# Patient Record
Sex: Female | Born: 1985 | Race: Asian | Hispanic: No | Marital: Single | Smoking: Never smoker
Health system: Southern US, Community
[De-identification: ages and names within clinical notes are randomized; demographics above are authoritative.]

---

## 2008-01-18 ENCOUNTER — Ambulatory Visit (HOSPITAL_COMMUNITY): Admission: RE | Admit: 2008-01-18 | Discharge: 2008-01-18 | Payer: Self-pay | Admitting: Family Medicine

## 2008-02-01 ENCOUNTER — Ambulatory Visit (HOSPITAL_COMMUNITY): Admission: RE | Admit: 2008-02-01 | Discharge: 2008-02-01 | Payer: Self-pay | Admitting: Family Medicine

## 2008-02-17 ENCOUNTER — Encounter: Payer: Self-pay | Admitting: Family Medicine

## 2008-02-17 ENCOUNTER — Ambulatory Visit: Payer: Self-pay | Admitting: Obstetrics & Gynecology

## 2008-02-17 LAB — CONVERTED CEMR LAB
BUN: 7 mg/dL (ref 6–23)
CO2: 23 meq/L (ref 19–32)
Creatinine 24 HR UR: 1455 mg/24hr (ref 700–1800)
Creatinine, Ser: 0.46 mg/dL (ref 0.40–1.20)
Creatinine, Urine: 85.6 mg/dL
Glucose, Bld: 122 mg/dL — ABNORMAL HIGH (ref 70–99)
TSH: 1.557 microintl units/mL (ref 0.350–4.50)
Total Bilirubin: 0.2 mg/dL — ABNORMAL LOW (ref 0.3–1.2)
Uric Acid, Serum: 3.8 mg/dL (ref 2.4–7.0)

## 2008-03-02 ENCOUNTER — Ambulatory Visit: Payer: Self-pay | Admitting: Obstetrics & Gynecology

## 2008-03-16 ENCOUNTER — Encounter: Payer: Self-pay | Admitting: Obstetrics and Gynecology

## 2008-03-16 ENCOUNTER — Ambulatory Visit (HOSPITAL_COMMUNITY): Admission: RE | Admit: 2008-03-16 | Discharge: 2008-03-16 | Payer: Self-pay | Admitting: Obstetrics & Gynecology

## 2008-03-16 ENCOUNTER — Ambulatory Visit: Payer: Self-pay | Admitting: Family Medicine

## 2008-04-06 ENCOUNTER — Ambulatory Visit: Payer: Self-pay | Admitting: Family Medicine

## 2008-04-06 ENCOUNTER — Encounter: Payer: Self-pay | Admitting: Obstetrics and Gynecology

## 2008-04-06 LAB — CONVERTED CEMR LAB
MCHC: 32 g/dL (ref 30.0–36.0)
MCV: 88.5 fL (ref 78.0–100.0)
Platelets: 249 10*3/uL (ref 150–400)

## 2008-04-07 ENCOUNTER — Ambulatory Visit (HOSPITAL_COMMUNITY): Admission: RE | Admit: 2008-04-07 | Discharge: 2008-04-07 | Payer: Self-pay | Admitting: Family Medicine

## 2008-04-20 ENCOUNTER — Ambulatory Visit: Payer: Self-pay | Admitting: Obstetrics & Gynecology

## 2008-04-20 ENCOUNTER — Encounter: Payer: Self-pay | Admitting: Obstetrics and Gynecology

## 2008-05-04 ENCOUNTER — Ambulatory Visit: Payer: Self-pay | Admitting: Family Medicine

## 2008-05-08 ENCOUNTER — Ambulatory Visit: Payer: Self-pay | Admitting: Obstetrics & Gynecology

## 2008-05-15 ENCOUNTER — Ambulatory Visit: Payer: Self-pay | Admitting: Obstetrics & Gynecology

## 2008-05-18 ENCOUNTER — Ambulatory Visit: Payer: Self-pay | Admitting: Obstetrics & Gynecology

## 2008-05-18 ENCOUNTER — Ambulatory Visit (HOSPITAL_COMMUNITY): Admission: RE | Admit: 2008-05-18 | Discharge: 2008-05-18 | Payer: Self-pay | Admitting: Family Medicine

## 2008-05-22 ENCOUNTER — Ambulatory Visit: Payer: Self-pay | Admitting: Obstetrics & Gynecology

## 2008-05-25 ENCOUNTER — Ambulatory Visit: Payer: Self-pay | Admitting: Family Medicine

## 2008-05-29 ENCOUNTER — Ambulatory Visit: Payer: Self-pay | Admitting: Obstetrics & Gynecology

## 2008-06-01 ENCOUNTER — Encounter: Payer: Self-pay | Admitting: Obstetrics and Gynecology

## 2008-06-01 ENCOUNTER — Ambulatory Visit: Payer: Self-pay | Admitting: Obstetrics & Gynecology

## 2008-06-02 ENCOUNTER — Encounter: Payer: Self-pay | Admitting: Obstetrics & Gynecology

## 2008-06-05 ENCOUNTER — Ambulatory Visit: Payer: Self-pay | Admitting: Obstetrics & Gynecology

## 2008-06-08 ENCOUNTER — Encounter: Payer: Self-pay | Admitting: Obstetrics and Gynecology

## 2008-06-08 ENCOUNTER — Ambulatory Visit: Payer: Self-pay | Admitting: Family Medicine

## 2008-06-12 ENCOUNTER — Ambulatory Visit: Payer: Self-pay | Admitting: Family Medicine

## 2008-06-15 ENCOUNTER — Ambulatory Visit: Payer: Self-pay | Admitting: Family Medicine

## 2008-06-19 ENCOUNTER — Ambulatory Visit: Payer: Self-pay | Admitting: Family Medicine

## 2008-06-22 ENCOUNTER — Ambulatory Visit: Payer: Self-pay | Admitting: Obstetrics & Gynecology

## 2008-06-26 ENCOUNTER — Ambulatory Visit: Payer: Self-pay | Admitting: Obstetrics & Gynecology

## 2008-06-28 ENCOUNTER — Ambulatory Visit: Payer: Self-pay | Admitting: Advanced Practice Midwife

## 2008-06-28 ENCOUNTER — Inpatient Hospital Stay (HOSPITAL_COMMUNITY): Admission: AD | Admit: 2008-06-28 | Discharge: 2008-07-01 | Payer: Self-pay | Admitting: Family Medicine

## 2009-08-18 ENCOUNTER — Inpatient Hospital Stay (HOSPITAL_COMMUNITY): Admission: AD | Admit: 2009-08-18 | Discharge: 2009-08-18 | Payer: Self-pay | Admitting: Obstetrics and Gynecology

## 2009-08-19 ENCOUNTER — Inpatient Hospital Stay (HOSPITAL_COMMUNITY): Admission: AD | Admit: 2009-08-19 | Discharge: 2009-08-20 | Payer: Self-pay | Admitting: Obstetrics and Gynecology

## 2010-03-29 ENCOUNTER — Inpatient Hospital Stay (HOSPITAL_COMMUNITY)
Admission: AD | Admit: 2010-03-29 | Discharge: 2010-03-29 | Payer: Self-pay | Source: Home / Self Care | Admitting: Obstetrics & Gynecology

## 2010-04-20 ENCOUNTER — Inpatient Hospital Stay (HOSPITAL_COMMUNITY)
Admission: AD | Admit: 2010-04-20 | Discharge: 2010-04-21 | Payer: Self-pay | Source: Home / Self Care | Attending: Obstetrics and Gynecology | Admitting: Obstetrics and Gynecology

## 2010-07-16 LAB — URINE MICROSCOPIC-ADD ON

## 2010-07-16 LAB — WET PREP, GENITAL
Clue Cells Wet Prep HPF POC: NONE SEEN
Yeast Wet Prep HPF POC: NONE SEEN

## 2010-07-16 LAB — CBC
HCT: 35.5 % — ABNORMAL LOW (ref 36.0–46.0)
Hemoglobin: 11.9 g/dL — ABNORMAL LOW (ref 12.0–15.0)
MCV: 87 fL (ref 78.0–100.0)
RDW: 14.3 % (ref 11.5–15.5)
WBC: 9 10*3/uL (ref 4.0–10.5)

## 2010-07-16 LAB — URINALYSIS, ROUTINE W REFLEX MICROSCOPIC
Ketones, ur: NEGATIVE mg/dL
Protein, ur: NEGATIVE mg/dL
Urobilinogen, UA: 0.2 mg/dL (ref 0.0–1.0)

## 2010-07-16 LAB — GC/CHLAMYDIA PROBE AMP, GENITAL: GC Probe Amp, Genital: NEGATIVE

## 2010-07-23 LAB — DIFFERENTIAL
Basophils Absolute: 0 10*3/uL (ref 0.0–0.1)
Basophils Relative: 0 % (ref 0–1)
Eosinophils Absolute: 0.1 10*3/uL (ref 0.0–0.7)
Eosinophils Relative: 1 % (ref 0–5)
Monocytes Absolute: 1 10*3/uL (ref 0.1–1.0)
Monocytes Relative: 7 % (ref 3–12)
Neutro Abs: 9 10*3/uL — ABNORMAL HIGH (ref 1.7–7.7)

## 2010-07-23 LAB — CBC
HCT: 39.2 % (ref 36.0–46.0)
Hemoglobin: 12.7 g/dL (ref 12.0–15.0)
MCHC: 32.3 g/dL (ref 30.0–36.0)
MCHC: 33.2 g/dL (ref 30.0–36.0)
MCV: 88 fL (ref 78.0–100.0)
MCV: 88 fL (ref 78.0–100.0)
RBC: 4.45 MIL/uL (ref 3.87–5.11)
RDW: 15.4 % (ref 11.5–15.5)
RDW: 15.5 % (ref 11.5–15.5)

## 2010-08-19 LAB — POCT URINALYSIS DIP (DEVICE)
Bilirubin Urine: NEGATIVE
Bilirubin Urine: NEGATIVE
Glucose, UA: NEGATIVE mg/dL
Glucose, UA: NEGATIVE mg/dL
Ketones, ur: NEGATIVE mg/dL
Ketones, ur: NEGATIVE mg/dL
Nitrite: NEGATIVE
Nitrite: NEGATIVE
Protein, ur: 30 mg/dL — AB
Protein, ur: 30 mg/dL — AB
Specific Gravity, Urine: 1.015 (ref 1.005–1.030)
Urobilinogen, UA: 0.2 mg/dL (ref 0.0–1.0)
Urobilinogen, UA: 0.2 mg/dL (ref 0.0–1.0)
pH: 7 (ref 5.0–8.0)

## 2010-08-20 LAB — POCT URINALYSIS DIP (DEVICE)
Bilirubin Urine: NEGATIVE
Bilirubin Urine: NEGATIVE
Bilirubin Urine: NEGATIVE
Glucose, UA: NEGATIVE mg/dL
Glucose, UA: NEGATIVE mg/dL
Glucose, UA: NEGATIVE mg/dL
Hgb urine dipstick: NEGATIVE
Ketones, ur: NEGATIVE mg/dL
Ketones, ur: NEGATIVE mg/dL
Nitrite: NEGATIVE
Nitrite: NEGATIVE
Specific Gravity, Urine: 1.02 (ref 1.005–1.030)
Urobilinogen, UA: 0.2 mg/dL (ref 0.0–1.0)

## 2010-08-20 LAB — COMPREHENSIVE METABOLIC PANEL
ALT: 16 U/L (ref 0–35)
AST: 23 U/L (ref 0–37)
Albumin: 2.8 g/dL — ABNORMAL LOW (ref 3.5–5.2)
Alkaline Phosphatase: 192 U/L — ABNORMAL HIGH (ref 39–117)
BUN: 5 mg/dL — ABNORMAL LOW (ref 6–23)
CO2: 25 mEq/L (ref 19–32)
Calcium: 9.3 mg/dL (ref 8.4–10.5)
Chloride: 102 mEq/L (ref 96–112)
Creatinine, Ser: 0.48 mg/dL (ref 0.4–1.2)
GFR calc Af Amer: >60 mL/min (ref >60)
GFR calc non Af Amer: >60 mL/min (ref >60)
Glucose, Bld: 94 mg/dL (ref 70–99)
Potassium: 3.8 mEq/L (ref 3.5–5.1)
Sodium: 135 mEq/L (ref 135–145)
Total Bilirubin: 0.5 mg/dL (ref 0.3–1.2)
Total Protein: 7.3 g/dL (ref 6.0–8.3)

## 2010-08-20 LAB — CBC
HCT: 41.6 % (ref 36.0–46.0)
Hemoglobin: 13.6 g/dL (ref 12.0–15.0)
MCHC: 32.6 g/dL (ref 30.0–36.0)
MCV: 88.2 fL (ref 78.0–100.0)
Platelets: 234 10*3/uL (ref 150–400)
RBC: 4.72 MIL/uL (ref 3.87–5.11)
RDW: 15.2 % (ref 11.5–15.5)
WBC: 15.9 10*3/uL — ABNORMAL HIGH (ref 4.0–10.5)

## 2010-08-20 LAB — LACTATE DEHYDROGENASE: LDH: 118 U/L (ref 94–250)

## 2010-08-20 LAB — URIC ACID: Uric Acid, Serum: 5.1 mg/dL (ref 2.4–7.0)

## 2010-10-13 ENCOUNTER — Inpatient Hospital Stay (HOSPITAL_COMMUNITY)
Admission: AD | Admit: 2010-10-13 | Discharge: 2010-10-14 | DRG: 775 | Disposition: A | Discharge status: Home / Self Care | Payer: Medicaid Other | Source: Ambulatory Visit | Attending: Obstetrics and Gynecology | Admitting: Obstetrics and Gynecology

## 2010-10-13 LAB — CBC
Hemoglobin: 12.4 g/dL (ref 12.0–15.0)
MCH: 26.1 pg (ref 26.0–34.0)
Platelets: 254 10*3/uL (ref 150–400)
RBC: 4.76 MIL/uL (ref 3.87–5.11)
WBC: 14.8 10*3/uL — ABNORMAL HIGH (ref 4.0–10.5)

## 2010-10-13 LAB — ABO/RH: ABO/RH(D): O POS

## 2010-10-13 LAB — RPR: RPR Ser Ql: NONREACTIVE

## 2010-10-14 LAB — CBC
HCT: 30.7 % — ABNORMAL LOW (ref 36.0–46.0)
MCHC: 31.9 g/dL (ref 30.0–36.0)
MCV: 80.4 fL (ref 78.0–100.0)
RDW: 16.7 % — ABNORMAL HIGH (ref 11.5–15.5)

## 2011-02-04 LAB — POCT URINALYSIS DIP (DEVICE)
Bilirubin Urine: NEGATIVE
Hgb urine dipstick: NEGATIVE
Hgb urine dipstick: NEGATIVE
Nitrite: NEGATIVE
Nitrite: NEGATIVE
Nitrite: NEGATIVE
Urobilinogen, UA: 0.2
Urobilinogen, UA: 0.2
Urobilinogen, UA: 0.2
pH: 6.5
pH: 7
pH: 7

## 2011-02-07 LAB — POCT URINALYSIS DIP (DEVICE)
Glucose, UA: NEGATIVE mg/dL
Hgb urine dipstick: NEGATIVE
Hgb urine dipstick: NEGATIVE
Ketones, ur: NEGATIVE mg/dL
Nitrite: NEGATIVE
Protein, ur: 100 mg/dL — AB
Protein, ur: 30 mg/dL — AB
Protein, ur: NEGATIVE mg/dL
Specific Gravity, Urine: 1.02 (ref 1.005–1.030)
Urobilinogen, UA: 0.2 mg/dL (ref 0.0–1.0)
Urobilinogen, UA: 0.2 mg/dL (ref 0.0–1.0)
pH: 6.5 (ref 5.0–8.0)
pH: 7 (ref 5.0–8.0)
pH: 7 (ref 5.0–8.0)

## 2011-05-04 IMAGING — US US OB COMP LESS 14 WK
1 series · 13 of 28 positions shown · non-contrast
Comparison: Prior ultrasound of pregnancy performed 05/18/2008

CLINICAL DATA: Vaginal bleeding.

OBSTETRIC <14 WK ULTRASOUND
TECHNIQUE: Transabdominal ultrasound was performed for evaluation
of the gestation as well as the maternal uterus and adnexal
regions.

[Series 1: us ob comp less 14 wks · 0.19mm/px · 13 of 44 slices shown]
[im 2/44]
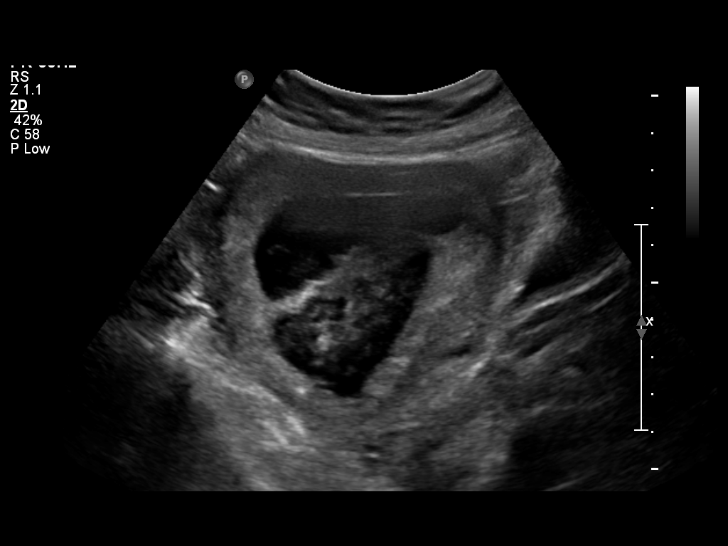
[im 5/44]
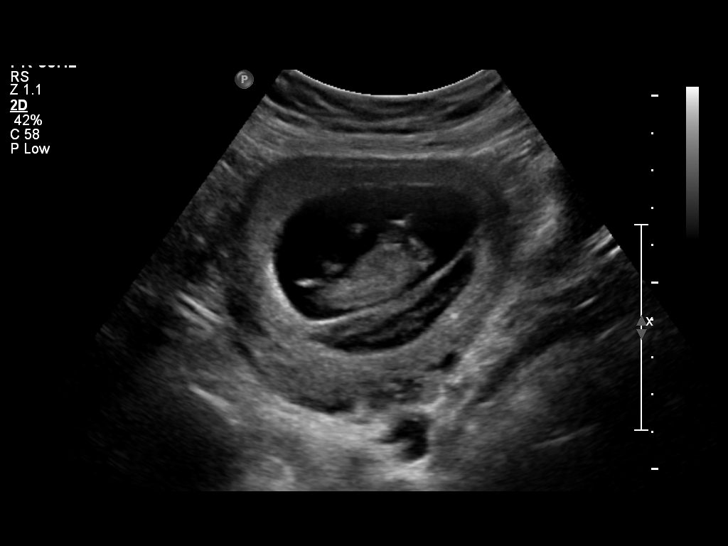
[im 8/44]
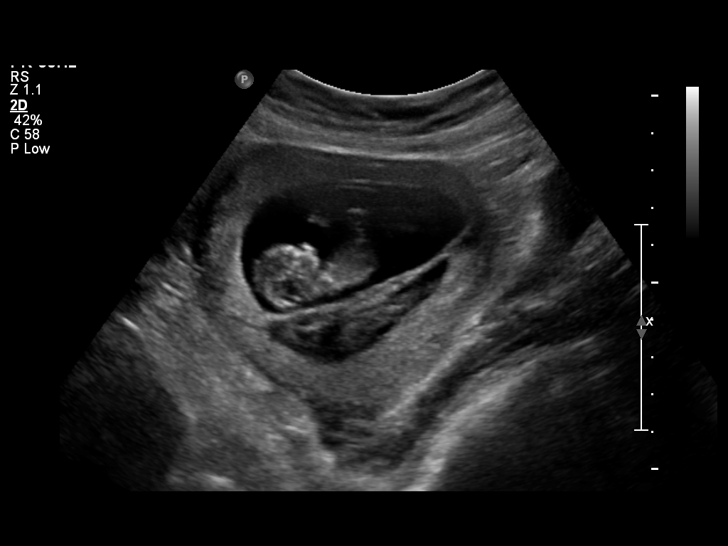
[im 12/44]
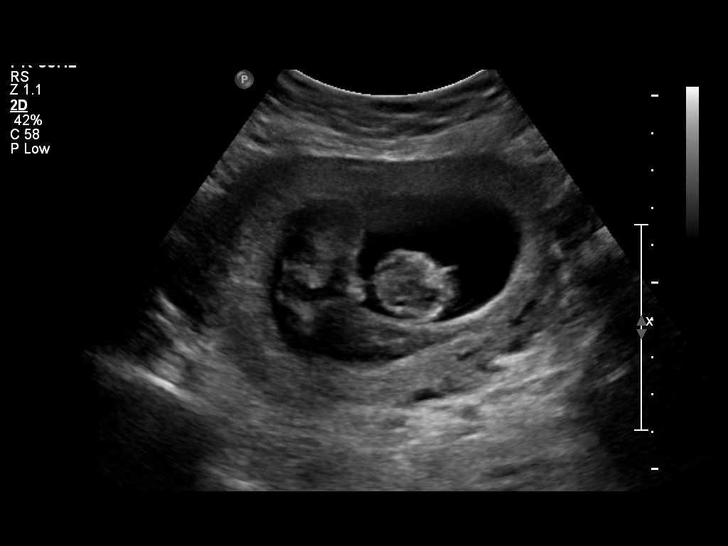
[im 15/44]
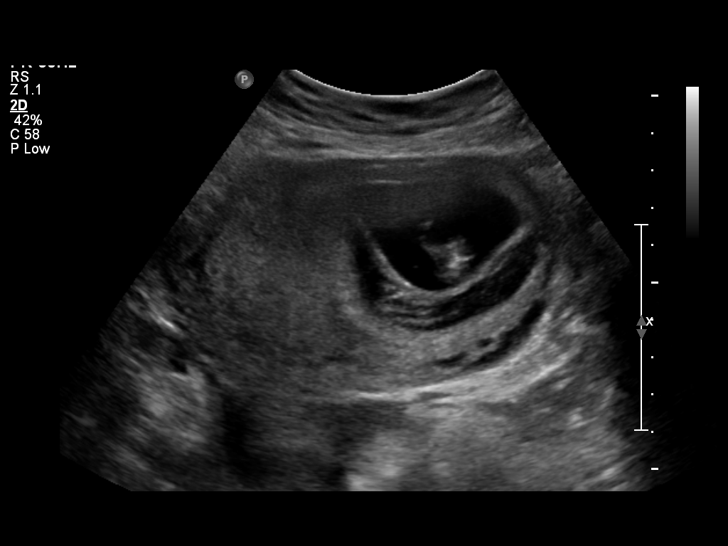
[im 18/44]
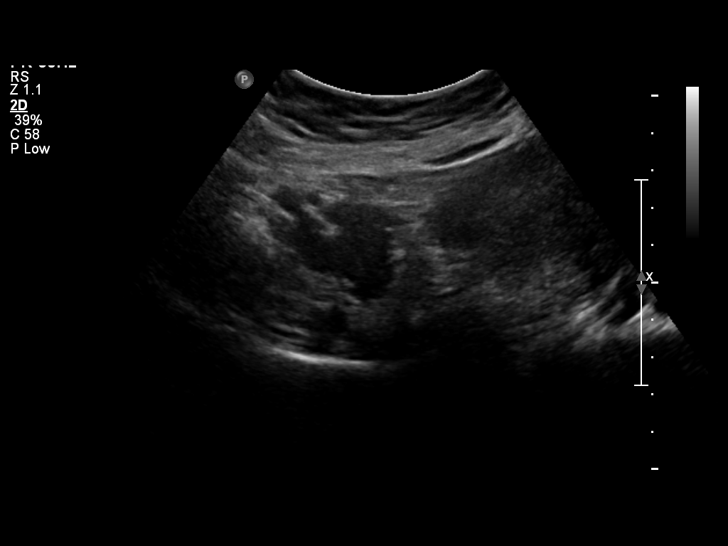
[im 23/44]
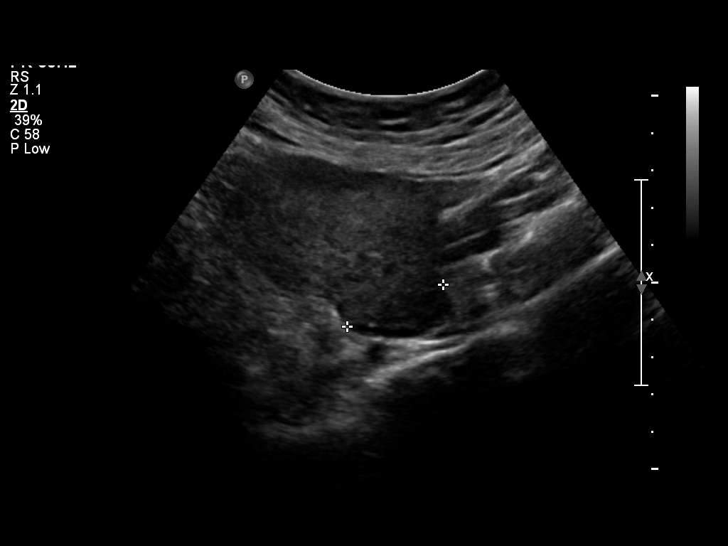
[im 26/44]
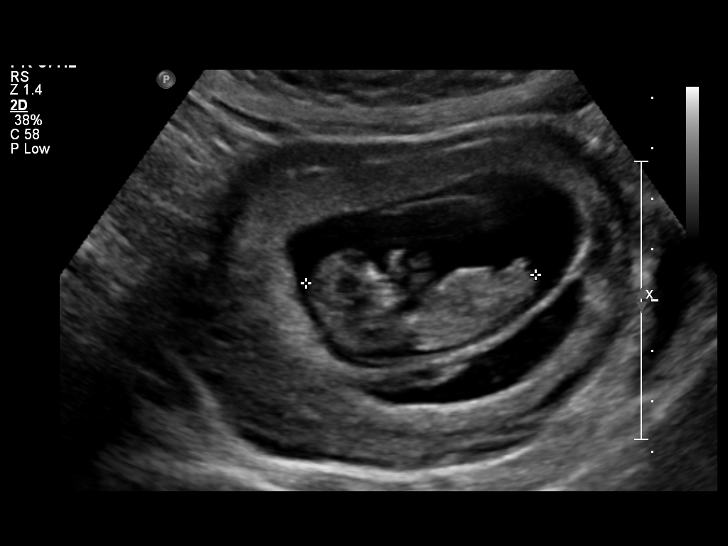
[im 29/44]
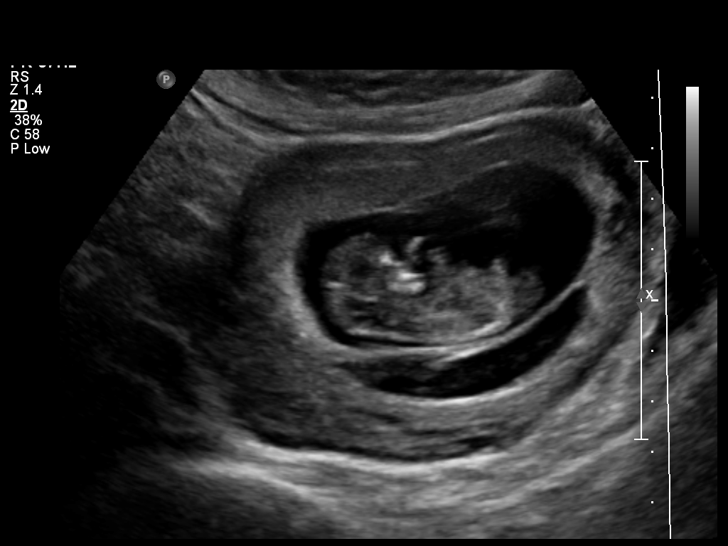
[im 32/44]
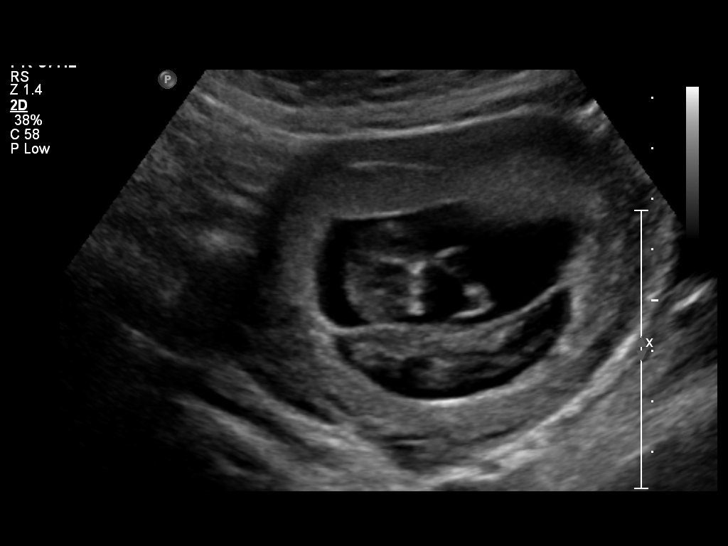
[im 36/44]
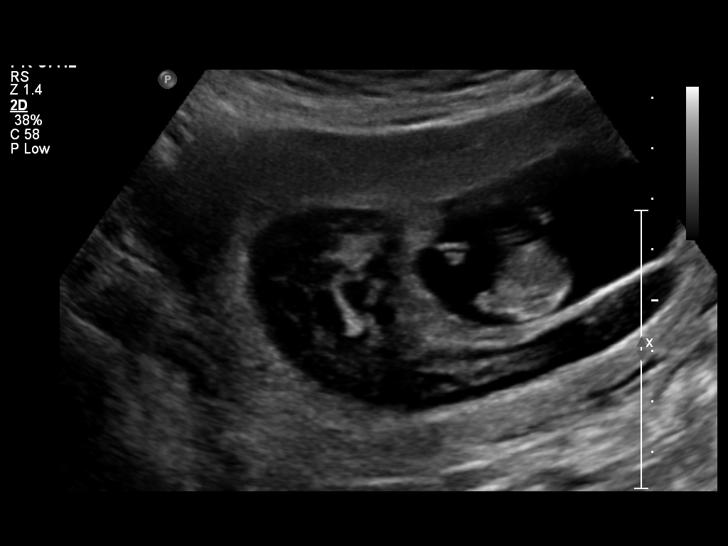
[im 39/44]
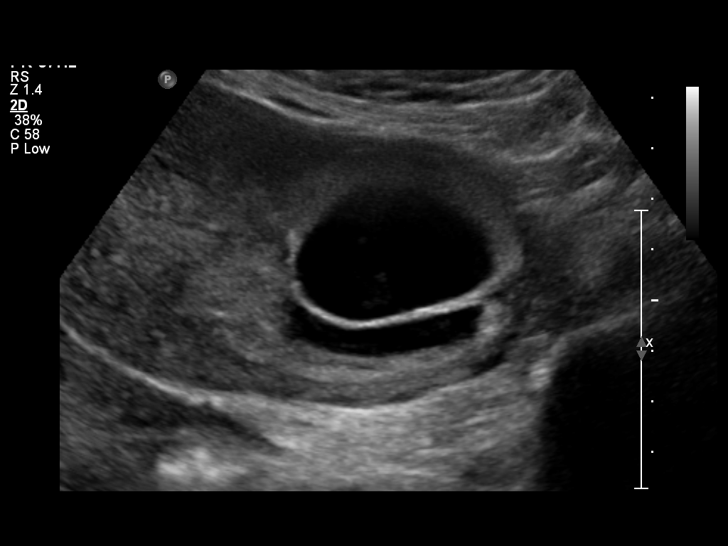
[im 42/44]
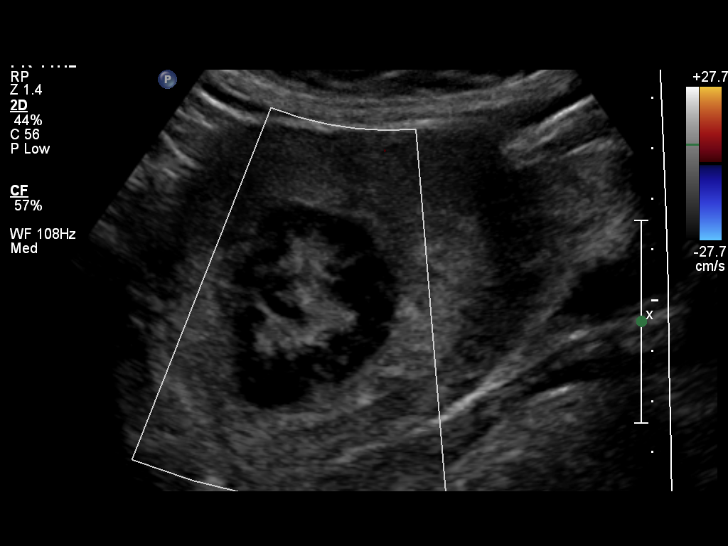

[13 of 28 positions shown; findings below may reference images not displayed]

Intrauterine gestational sac: Single; visualized/normal in shape.
Yolk sac: Yes
Embryo: Yes
Cardiac Activity: Yes
Heart Rate: 169 bpm

CRL:  4.6 cm  11w  3d         US EDC: 10/15/2010

Maternal uterus/Adnexae:
A large amount of subchorionic hemorrhage is noted.  The uterus is
otherwise unremarkable in appearance.

The ovaries are within normal limits.  The right ovary measures
x 1.9 x 1.9 cm, while the left ovary measures 3.0 x 1.5 x 2.8 cm.
No suspicious adnexal masses are seen.  There is no definite
evidence for ovarian torsion.

No free fluid is seen in the pelvic cul-de-sac.
IMPRESSION: 1.  Single live intrauterine pregnancy, with a crown-rump length of
4.6 cm, corresponding to a gestational age of 11 weeks 3 days.
This matches the patient's gestational age of 12 weeks 0 days by
LMP, and reflects an estimated date of delivery October 11, 2010.
2.  Large amount of subchorionic hemorrhage noted.

## 2012-09-19 ENCOUNTER — Inpatient Hospital Stay (HOSPITAL_COMMUNITY)
Admission: AD | Admit: 2012-09-19 | Discharge: 2012-09-19 | Disposition: A | Discharge status: Home / Self Care | Payer: Self-pay | Source: Ambulatory Visit | Attending: Obstetrics & Gynecology | Admitting: Obstetrics & Gynecology

## 2012-09-19 ENCOUNTER — Inpatient Hospital Stay (HOSPITAL_COMMUNITY): Payer: Self-pay

## 2012-09-19 ENCOUNTER — Encounter (HOSPITAL_COMMUNITY): Payer: Self-pay | Admitting: *Deleted

## 2012-09-19 DIAGNOSIS — O039 Complete or unspecified spontaneous abortion without complication: Secondary | ICD-10-CM | POA: Insufficient documentation

## 2012-09-19 LAB — URINE MICROSCOPIC-ADD ON

## 2012-09-19 LAB — CBC WITH DIFFERENTIAL/PLATELET
Basophils Absolute: 0 10*3/uL (ref 0.0–0.1)
Basophils Relative: 0 % (ref 0–1)
Eosinophils Relative: 2 % (ref 0–5)
HCT: 39.4 % (ref 36.0–46.0)
Hemoglobin: 12.9 g/dL (ref 12.0–15.0)
MCHC: 32.7 g/dL (ref 30.0–36.0)
MCV: 87.6 fL (ref 78.0–100.0)
Monocytes Absolute: 1.2 10*3/uL — ABNORMAL HIGH (ref 0.1–1.0)
Monocytes Relative: 9 % (ref 3–12)
RDW: 13.2 % (ref 11.5–15.5)

## 2012-09-19 LAB — URINALYSIS, ROUTINE W REFLEX MICROSCOPIC
Bilirubin Urine: NEGATIVE
Ketones, ur: NEGATIVE mg/dL
Protein, ur: NEGATIVE mg/dL
Urobilinogen, UA: 0.2 mg/dL (ref 0.0–1.0)

## 2012-09-19 LAB — WET PREP, GENITAL
Trich, Wet Prep: NONE SEEN
Yeast Wet Prep HPF POC: NONE SEEN

## 2012-09-19 MED ORDER — CEPHALEXIN 500 MG PO CAPS: 500.0000 mg | ORAL_CAPSULE | Freq: Four times a day (QID) | ORAL | Status: AC

## 2012-09-19 MED ORDER — IBUPROFEN 600 MG PO TABS: 600.0000 mg | ORAL_TABLET | Freq: Four times a day (QID) | ORAL | Status: AC | PRN

## 2012-09-19 MED ORDER — OXYCODONE-ACETAMINOPHEN 5-325 MG PO TABS: 2.0000 | ORAL_TABLET | ORAL | Status: AC | PRN

## 2012-09-19 MED ORDER — PROMETHAZINE HCL 25 MG PO TABS: 25.0000 mg | ORAL_TABLET | Freq: Four times a day (QID) | ORAL | Status: AC | PRN

## 2012-09-19 NOTE — MAU Note (Signed)
Pt reports she has been spotting since Thursday, this am had cramping. Has had cramping off/on all day.positive home preg test one month ago.

## 2012-09-19 NOTE — MAU Provider Note (Signed)
History     CSN: [id number]  Arrival date & time 09/19/12  1927   None     Chief Complaint  Patient presents with  . Possible Pregnancy  . Dysmenorrhea    (Consider location/radiation/quality/duration/timing/severity/associated sxs/prior treatment) HPI Edward Delamar is a 27 y.o. [id number] at 13 3/7 wks by unsure LMP 06/19/12. She started pink spotting with wiping 5/15. It ash occurred off/on since then. She had low cramping this am, none since. Her discharge is mucoid, pink, no odor or itching. No UTI S&S or GI changes. Hasn't felt pregnant for a few weeks.   History reviewed. No pertinent past medical history.  History reviewed. No pertinent past surgical history.  History reviewed. No pertinent family history.  History  Substance Use Topics  . Smoking status: Never Smoker   . Smokeless tobacco: Never Used  . Alcohol Use: No    OB History   Grav Para Term Preterm Abortions TAB SAB Ect Mult Living   4 3 3       3       Review of Systems  Constitutional: Negative for fever and chills.  Gastrointestinal: Negative for nausea, vomiting, diarrhea and constipation.  Genitourinary: Positive for vaginal bleeding, vaginal discharge and pelvic pain. Negative for dysuria, urgency and frequency.    Allergies  Review of patient's allergies indicates no known allergies.  Home Medications   Current Outpatient Rx  Name  Route  Sig  Dispense  Refill  . acetaminophen (TYLENOL) 325 MG tablet   Oral   Take 650 mg by mouth every 6 (six) hours as needed for pain.         . cephALEXin (KEFLEX) 500 MG capsule   Oral   Take 1 capsule (500 mg total) by mouth 4 (four) times daily.   20 capsule   0   . ibuprofen (ADVIL,MOTRIN) 600 MG tablet   Oral   Take 1 tablet (600 mg total) by mouth every 6 (six) hours as needed for pain.   30 tablet   0   . oxyCODONE-acetaminophen (PERCOCET/ROXICET) 5-325 MG per tablet   Oral   Take 2 tablets by mouth every 4 (four) hours as needed for pain.  15 tablet   0   . promethazine (PHENERGAN) 25 MG tablet   Oral   Take 1 tablet (25 mg total) by mouth every 6 (six) hours as needed for nausea.   30 tablet   0     BP 121/75  Pulse 64  Temp(Src) 98.2 F (36.8 C) (Oral)  Resp 18  Ht 5\' 1"  (1.549 m)  Wt 176 lb (79.833 kg)  BMI 33.27 kg/m2  SpO2 100%  LMP 06/19/2012  Physical Exam  Constitutional: She is oriented to person, place, and time. She appears well-developed and well-nourished.  Abdominal: Soft. There is no tenderness.  Genitourinary:  Pelvic exam: Vulva- nl anatomy, skin intact Vagina- small amt mucoid bloody discharge Cx- parous Uterus- 6-8 wk size, non tender Adn- non tender, no masses palp  Musculoskeletal: Normal range of motion.  Neurological: She is alert and oriented to person, place, and time.  Skin: Skin is warm and dry.  Psychiatric: She has a normal mood and affect. Her behavior is normal.    ED Course  Procedures (including critical care time) 2055 awaiting results. Care to Joseph Berkshire, PA Labs Reviewed  WET PREP, GENITAL - Abnormal; Notable for the following:    Clue Cells Wet Prep HPF POC FEW (*)    WBC, Wet Prep  HPF POC MODERATE (*)    All other components within normal limits  URINALYSIS, ROUTINE W REFLEX MICROSCOPIC - Abnormal; Notable for the following:    Hgb urine dipstick LARGE (*)    Nitrite POSITIVE (*)    Leukocytes, UA TRACE (*)    All other components within normal limits  CBC WITH DIFFERENTIAL - Abnormal; Notable for the following:    WBC 12.4 (*)    Lymphs Abs 4.3 (*)    Monocytes Absolute 1.2 (*)    All other components within normal limits  HCG, QUANTITATIVE, PREGNANCY - Abnormal; Notable for the following:    hCG, Beta Chain, Quant, S 1594 (*)    All other components within normal limits  URINE MICROSCOPIC-ADD ON - Abnormal; Notable for the following:    Squamous Epithelial / LPF FEW (*)    Bacteria, UA FEW (*)    Crystals CA OXALATE CRYSTALS (*)    All other  components within normal limits  POCT PREGNANCY, URINE - Abnormal; Notable for the following:    Preg Test, Ur POSITIVE (*)    All other components within normal limits  URINE CULTURE  GC/CHLAMYDIA PROBE AMP   US Ob Comp Less 14 Wks  09/19/2012   OBSTETRICAL ULTRASOUND: This exam was performed within a [facility]. The OB US report was generated in the [facility] system, and faxed to the ordering physician.   This report is also available in [employer] and in the [employer]. See [facility] Obstetric US report.    1. SAB (spontaneous abortion)       MDM   2100 - Care assumed from Eveline Keto, NP Patient waiting for [place].   MDM Discussed [place] results with Dr. Despina Hidden. Ok to offer cytotec. Discussed options with patient of expectant management vs cytotec for SAB. Patient has chosen [facility]. Appropriate pain medications will be dispensed. Bleeding precautions discussed.   A: SAB UTI  P: Discharge home Rx for percocet given. Rx for Phenergan, Ibuprofen and Keflex sent to patient's pharmacy Bleeding precautions discussed Patient will follow-up in [facility] clinic in ~ 2 weeks. Referral sent to clinic, they will contact patient with an appointment Patient may return to MAU as needed or if her condition were to change or worsen  Freddi Starr, PA-C 09/20/2012 12:36 AM

## 2012-09-20 LAB — GC/CHLAMYDIA PROBE AMP
CT Probe RNA: NEGATIVE
GC Probe RNA: NEGATIVE

## 2012-09-21 LAB — URINE CULTURE: Colony Count: 15000

## 2012-09-22 ENCOUNTER — Encounter (HOSPITAL_COMMUNITY): Payer: Self-pay | Admitting: *Deleted

## 2012-09-22 ENCOUNTER — Inpatient Hospital Stay (HOSPITAL_COMMUNITY)
Admission: AD | Admit: 2012-09-22 | Discharge: 2012-09-22 | Disposition: A | Discharge status: Home / Self Care | Payer: Self-pay | Source: Ambulatory Visit | Attending: Obstetrics & Gynecology | Admitting: Obstetrics & Gynecology

## 2012-09-22 DIAGNOSIS — O039 Complete or unspecified spontaneous abortion without complication: Secondary | ICD-10-CM | POA: Insufficient documentation

## 2012-09-22 NOTE — MAU Note (Signed)
Pt was seen on Sunday and was told she was having a miscarriage.  Monday she started to have cramping and passed two large clots.  Today the cramping came like contractions pains and passed more in toilet in MAU.

## 2012-09-22 NOTE — MAU Provider Note (Signed)
History     CSN: [id number]  Arrival date and time: 09/22/12 1622   None     Chief Complaint  Patient presents with  . Threatened Miscarriage  . Vaginal Bleeding   HPI 27 y.o. [id number] here c/o ongoing vaginal bleeding and cramping. States she had some strong cramping on Monday and passed some clots. Had strong cramping again today and passed a larger clot or tissue after arrival to MAU. Pain has since resolved. Seen in MAU on 5/18 and diagnosed with failed IUP, offered cytotec vs. Expectant mgmt, chose expectant mgmt. Given rx for percocet and motrin - states she has not been able to take the percocet because she is caring for her children, but motrin and tylenol aren't   History reviewed. No pertinent past medical history.  History reviewed. No pertinent past surgical history.  History reviewed. No pertinent family history.  History  Substance Use Topics  . Smoking status: Never Smoker   . Smokeless tobacco: Never Used  . Alcohol Use: No    Allergies: No Known Allergies  Prescriptions prior to admission  Medication Sig Dispense Refill  . acetaminophen (TYLENOL) 325 MG tablet Take 650 mg by mouth every 6 (six) hours as needed for pain.      . cephALEXin (KEFLEX) 500 MG capsule Take 1 capsule (500 mg total) by mouth 4 (four) times daily.  20 capsule  0  . ibuprofen (ADVIL,MOTRIN) 600 MG tablet Take 1 tablet (600 mg total) by mouth every 6 (six) hours as needed for pain.  30 tablet  0  . oxyCODONE-acetaminophen (PERCOCET/ROXICET) 5-325 MG per tablet Take 2 tablets by mouth every 4 (four) hours as needed for pain.  15 tablet  0  . promethazine (PHENERGAN) 25 MG tablet Take 1 tablet (25 mg total) by mouth every 6 (six) hours as needed for nausea.  30 tablet  0    Review of Systems  Constitutional: Negative.   Respiratory: Negative.   Cardiovascular: Negative.   Gastrointestinal: Positive for abdominal pain.  Genitourinary:       + bleeding    Physical Exam   Blood  pressure 111/85, pulse 77, temperature 98.5 F (36.9 C), temperature source Oral, resp. rate 18, height 5\' 1"  (1.549 m), weight 176 lb (79.833 kg), last menstrual period 06/19/2012, SpO2 100.00%.  Physical Exam  Nursing note and vitals reviewed. Constitutional: She is oriented to person, place, and time. She appears well-developed and well-nourished. No distress.  Cardiovascular: Normal rate.   Respiratory: Effort normal.  Musculoskeletal: Normal range of motion.  Neurological: She is alert and oriented to person, place, and time.  Skin: Skin is warm and dry.  Psychiatric: She has a normal mood and affect.    MAU Course  Procedures    Assessment and Plan   1. Abortion, spontaneous   Rev'd precautions and normal course of SAB, pt reassured. F/U in clinic as scheduled or sooner PRN.     Medication List    TAKE these medications       acetaminophen 325 MG tablet  Commonly known as:  TYLENOL  Take 650 mg by mouth every 6 (six) hours as needed for pain.     cephALEXin 500 MG capsule  Commonly known as:  KEFLEX  Take 1 capsule (500 mg total) by mouth 4 (four) times daily.     ibuprofen 600 MG tablet  Commonly known as:  ADVIL,MOTRIN  Take 1 tablet (600 mg total) by mouth every 6 (six) hours as needed for  pain.     oxyCODONE-acetaminophen 5-325 MG per tablet  Commonly known as:  PERCOCET/ROXICET  Take 2 tablets by mouth every 4 (four) hours as needed for pain.     promethazine 25 MG tablet  Commonly known as:  PHENERGAN  Take 1 tablet (25 mg total) by mouth every 6 (six) hours as needed for nausea.            Follow-up Information   Follow up with [facility]. (as scheduled)    Contact information:   [address] [place] [address] [phone]        [facility] 09/22/2012, 4:44 PM

## 2012-09-29 ENCOUNTER — Encounter: Payer: Medicaid Other | Admitting: Obstetrics & Gynecology

## 2012-10-07 ENCOUNTER — Encounter: Payer: Self-pay | Admitting: Obstetrics and Gynecology

## 2013-07-25 ENCOUNTER — Encounter (HOSPITAL_COMMUNITY): Payer: Self-pay | Admitting: *Deleted

## 2013-10-25 IMAGING — US US OB COMP LESS 14 WK
1 series · 14 of 18 positions shown · non-contrast
Comparison: none

[Series 1: us ob comp less 14 wks · 18 acquisitions, 14 frames shown]
[im 1/18]
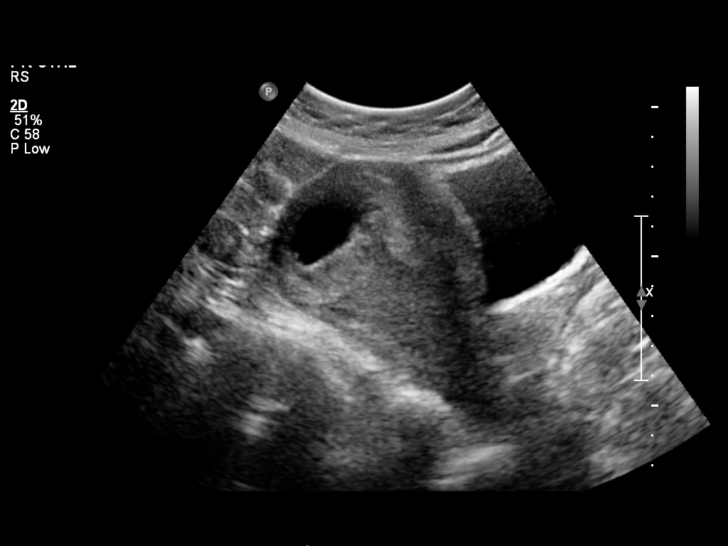
[im 2/18]
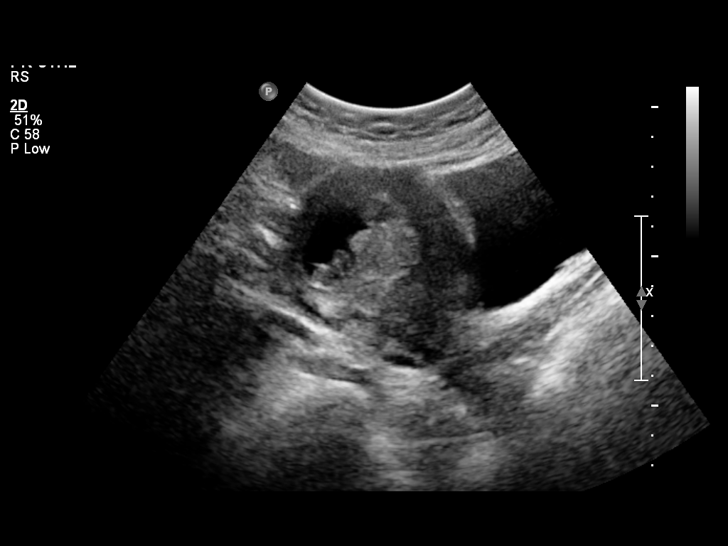
[im 4/18]
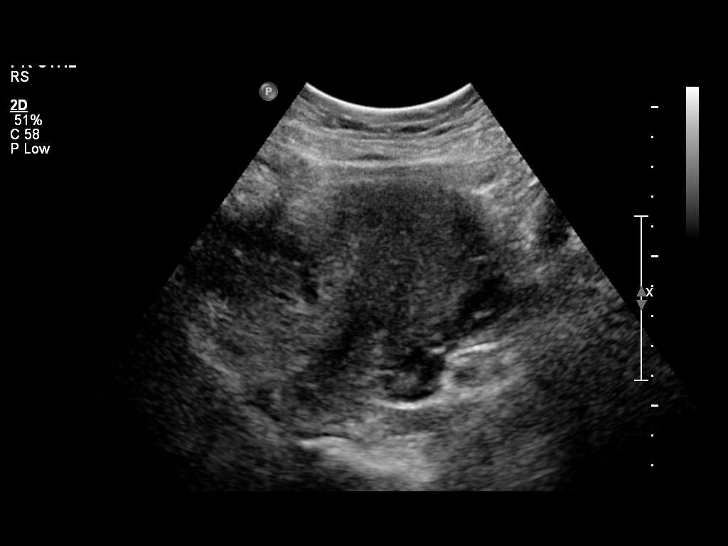
[im 5/18]
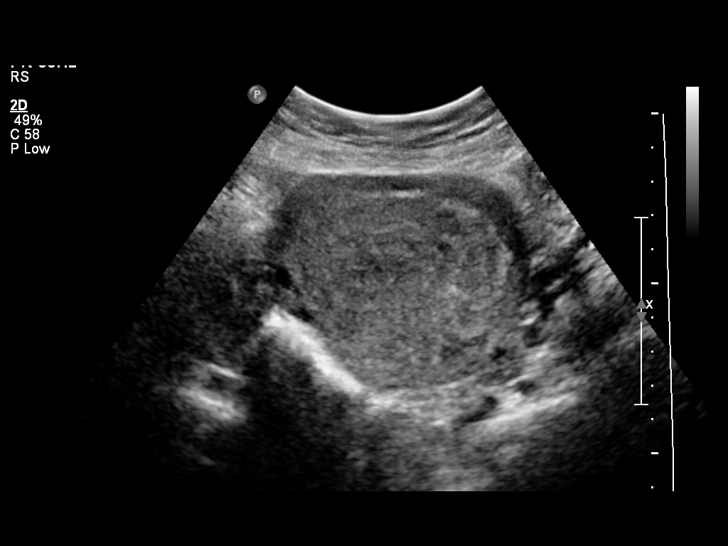
[im 6/18]
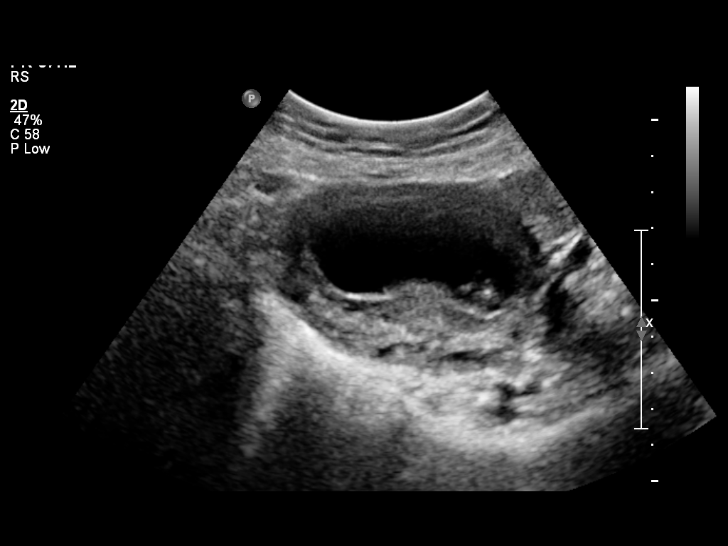
[im 8/18]
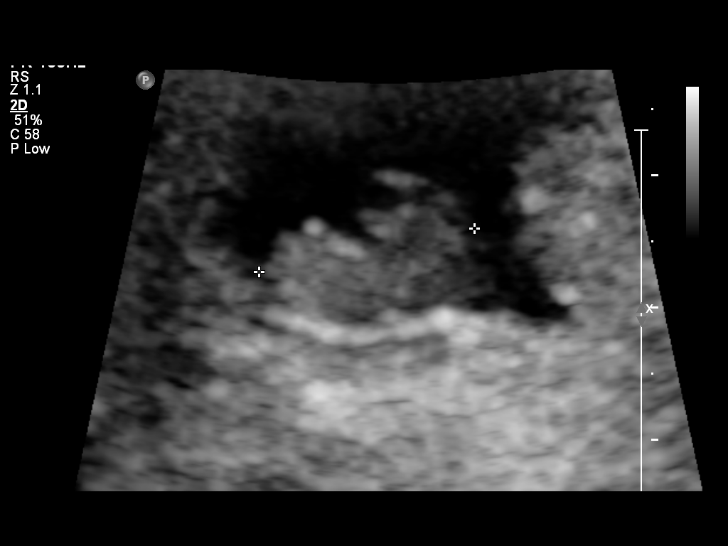
[im 9/18]
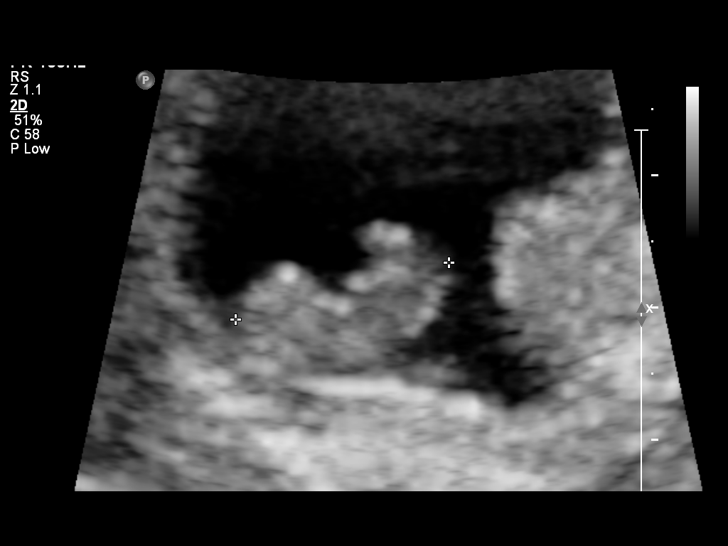
[im 10/18]
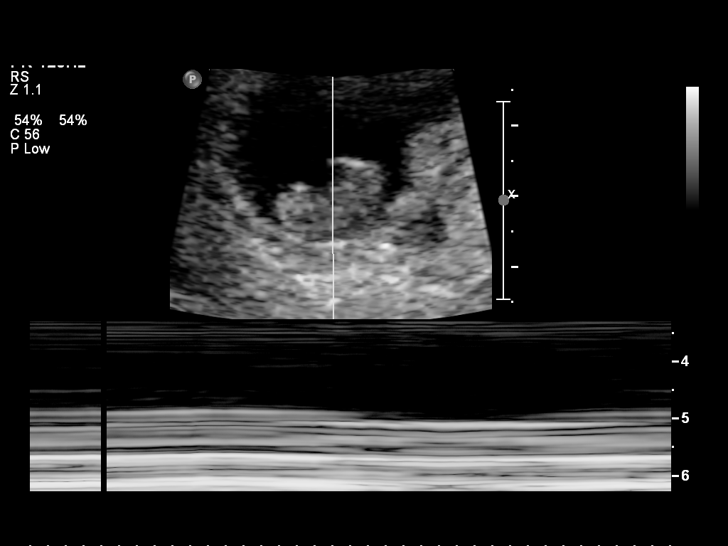
[im 11/18]
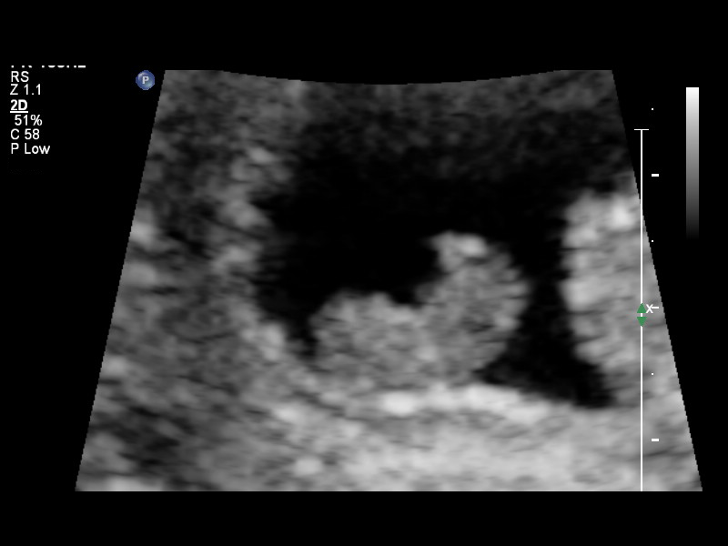
[im 13/18]
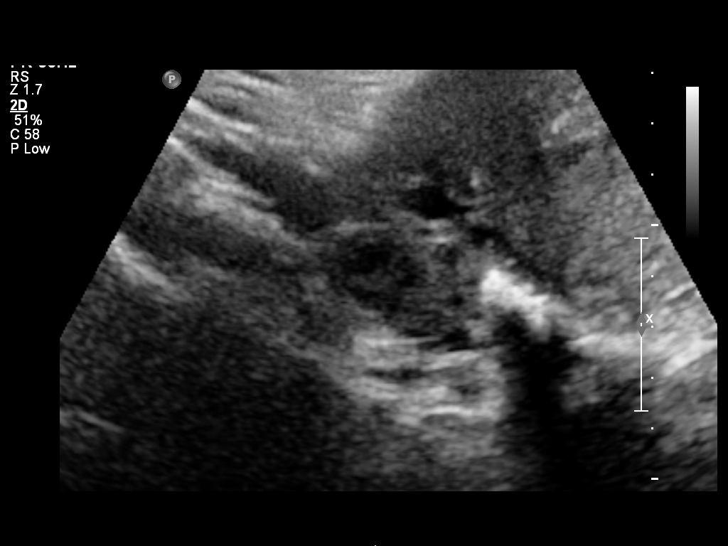
[im 14/18]
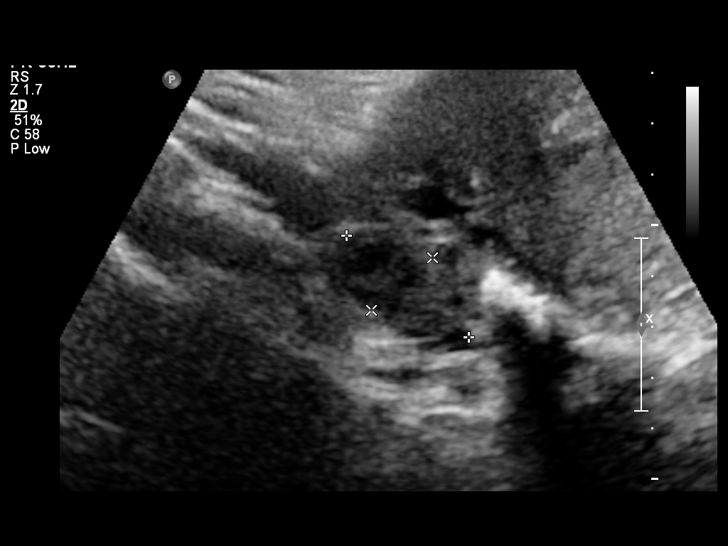
[im 15/18]
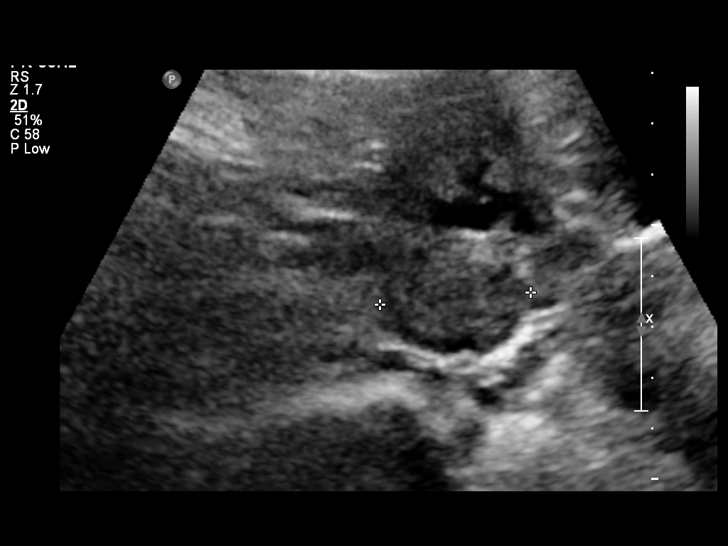
[im 17/18]
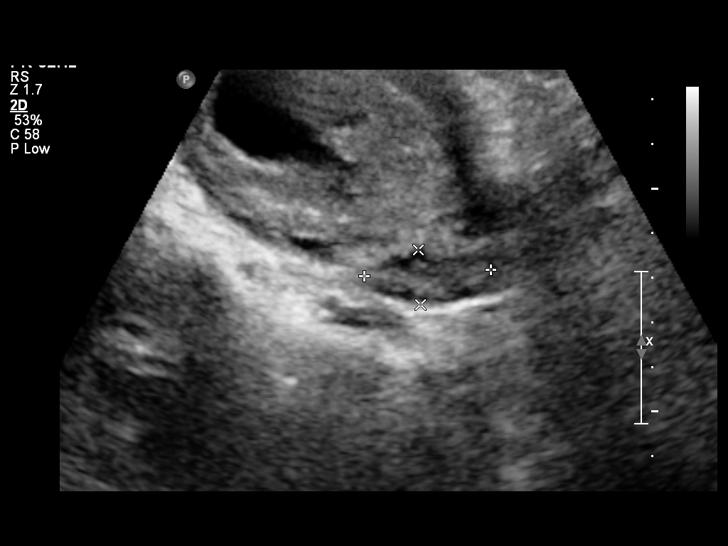
[im 18/18]
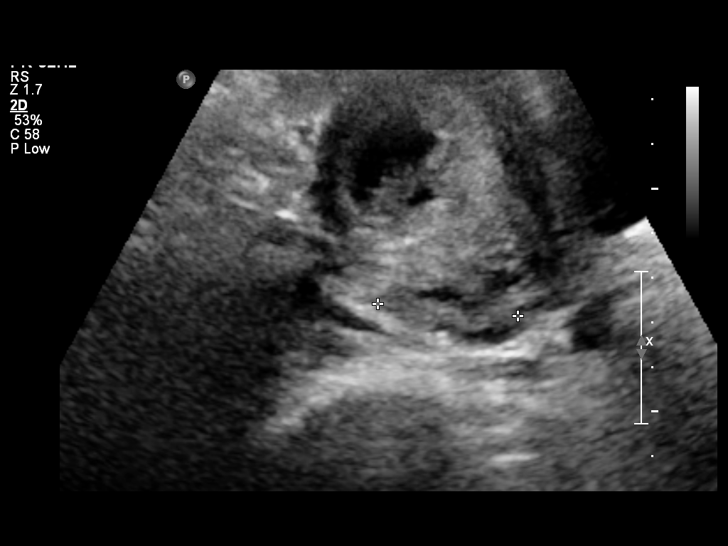

[14 of 18 positions shown; findings below may reference images not displayed]

OBSTETRICS REPORT
                      (Signed Final 09/19/2012 [DATE])

Service(s) Provided

 US OB COMP LESS 14 WKS                                76801.0
Indications

 Vaginal bleeding, unknown etiology
 Pain - Abdominal/Pelvic
Fetal Evaluation

 Num Of Fetuses:    1
 Preg. Location:    Intrauterine
 Gest. Sac:         Irregular
 Yolk Sac:          Not visualized
 Fetal Pole:        Visualized
 Cardiac Activity:  Absent
Biometry

 CRL:     16.7  mm    G. Age:   8w 0d                  EDD:   05/01/13
Gestational Age

 Best:          8w 0d     Det. By:   U/S C R L (09/19/12)     EDD:   05/01/13
Cervix Uterus Adnexa

 Cervix:       Normal appearance by transabdominal scan.
 Left Ovary:   Within normal limits.
 Right Ovary:  Within normal limits.

 Adnexa:     No abnormality visualized.
Impression

 Findings meet definitive criteria for failed pregnancy.  (Ref:
 SRU consensus guidelines:  Diagnostic Criteria for Nonviable
 Pregnancy Early in the First Trimester. N Engl J Med 5867:
 [DATE].)

 questions or concerns.

## 2014-03-06 ENCOUNTER — Encounter (HOSPITAL_COMMUNITY): Payer: Self-pay | Admitting: *Deleted

## 2018-10-06 ENCOUNTER — Other Ambulatory Visit: Payer: Self-pay

## 2018-10-06 ENCOUNTER — Emergency Department (HOSPITAL_COMMUNITY): Payer: Self-pay

## 2018-10-06 ENCOUNTER — Encounter (HOSPITAL_COMMUNITY): Payer: Self-pay | Admitting: Emergency Medicine

## 2018-10-06 ENCOUNTER — Emergency Department (HOSPITAL_COMMUNITY)
Admission: EM | Admit: 2018-10-06 | Discharge: 2018-10-07 | Disposition: A | Discharge status: Home / Self Care | Payer: Self-pay | Attending: Emergency Medicine | Admitting: Emergency Medicine

## 2018-10-06 DIAGNOSIS — R0602 Shortness of breath: Secondary | ICD-10-CM | POA: Insufficient documentation

## 2018-10-06 NOTE — ED Triage Notes (Signed)
Pt c/o shortness of breath that started last night. Denies chest pain/fever/cough or known sick contacts.

## 2018-10-07 ENCOUNTER — Other Ambulatory Visit: Payer: Self-pay

## 2018-10-07 MED ORDER — LORAZEPAM 1 MG PO TABS
0.5000 mg | ORAL_TABLET | Freq: Once | ORAL | Status: AC
  Administered 2018-10-07: 0.5 mg via ORAL
  Filled 2018-10-07: qty 1

## 2018-10-07 MED ORDER — LORAZEPAM 1 MG PO TABS: 0.5000 mg | ORAL_TABLET | Freq: Three times a day (TID) | ORAL | 0 refills | Status: AC | PRN

## 2018-10-07 NOTE — ED Notes (Signed)
Patient states that she feels much better and is no longer SOB.

## 2018-10-07 NOTE — ED Provider Notes (Signed)
Emergency Department Provider Note   I have reviewed the triage vital signs and the nursing notes.   HISTORY  Chief Complaint Shortness of Breath   HPI Julie Cook is a 33 y.o. female who presents emergency department today for shortness of breath.  Eventually the whole story came out that it sounds like this all started with an anxiety attack yesterday.  She started to get very anxious and then started to feel short of breath.  When that happened everything just became worse and worse and worse.  Slowly her anxiety got better but the shortness of breath seem to persist.  She has no chest pain, fever, cough, recent surgeries or long car rides, lower extremity swelling, lower extremity pain or rashes.  No history of adenitis before.  When asked to describe it she states that she feels like she is just not taken a full breath.  She does not feel like it is difficult for her to take a breath or that she has pain with breathing she just feels like she is not getting a full breath.   No other associated or modifying symptoms.    History reviewed. No pertinent past medical history.  There are no active problems to display for this patient.   History reviewed. No pertinent surgical history.  Current Outpatient Rx  . Order #: [id number] Class: Historical Med  . Order #: [id number] Class: Normal  . Order #: [id number] Class: Normal  . Order #: [id number] Class: Print  . Order #: [id number] Class: Print  . Order #: [id number] Class: Normal    Allergies Patient has no known allergies.  No family history on file.  Social History Social History   Tobacco Use  . Smoking status: Never Smoker  . Smokeless tobacco: Never Used  Substance Use Topics  . Alcohol use: No  . Drug use: No    Review of Systems  All other systems negative except as documented in the HPI. All pertinent positives and negatives as reviewed in the HPI. ____________________________________________   PHYSICAL EXAM:  VITAL  SIGNS: ED Triage Vitals  Enc Vitals Group     BP 10/06/18 1910 (!) 142/103     Pulse Rate 10/06/18 1910 84     Resp 10/06/18 1910 18     Temp 10/06/18 1910 98.8 F (37.1 C)     Temp Source 10/06/18 1910 Oral     SpO2 10/06/18 1910 100 %    Constitutional: Alert and oriented. Well appearing and in no acute distress. Eyes: Conjunctivae are normal. PERRL. EOMI. Head: Atraumatic. Nose: No congestion/rhinnorhea. Mouth/Throat: Mucous membranes are moist.  Oropharynx non-erythematous. Neck: No stridor.  No meningeal signs.   Cardiovascular: Normal rate, regular rhythm. Good peripheral circulation. Grossly normal heart sounds.   Respiratory: Normal respiratory effort.  No retractions. Lungs CTAB. Gastrointestinal: Soft and nontender. No distention.  Musculoskeletal: No lower extremity tenderness nor edema. No gross deformities of extremities. Neurologic:  Normal speech and language. No gross focal neurologic deficits are appreciated.  Skin:  Skin is warm, dry and intact. No rash noted.   ____________________________________________   LABS (all labs ordered are listed, but only abnormal results are displayed)  Labs Reviewed - No data to display ____________________________________________  EKG   EKG Interpretation  Date/Time:  Wednesday October 06 2018 19:16:09 EDT Ventricular Rate:  87 PR Interval:  160 QRS Duration: 86 QT Interval:  362 QTC Calculation: 435 R Axis:   86 Text Interpretation:  Normal sinus rhythm with sinus arrhythmia Normal ECG No old tracing  to compare Confirmed by Marily Memos [phone]) on 10/07/2018 1:18:57 AM      ____________________________________________  RADIOLOGY  Dg Chest 2 View  Result Date: 10/06/2018 CLINICAL DATA:  Shortness of breath EXAM: CHEST - 2 VIEW COMPARISON:  None. FINDINGS: The cardiac size is at the upper limits of normal. Both lungs are clear. The visualized skeletal structures are unremarkable. IMPRESSION: Borderline enlarged heart.   No acute cardiopulmonary process. Electronically Signed   By: Katherine Mantle M.D.   On: 10/06/2018 19:49    ____________________________________________   PROCEDURES  Procedure(s) performed:   Procedures   ____________________________________________   INITIAL IMPRESSION / ASSESSMENT AND PLAN / ED COURSE  I think likely patient had an anxiety attack and is just now having dyspnea related to concentrate on her breathing.  Her lungs were clear she has no stridor.  She has normal inspiratory next Tory time.  She has no wheezing.  Her vital signs are within normal limits to include no tachycardia, hypoxia.  She has no obvious edema or masses consider that as a cause for restricted breathing.  She states she still feels little bit worried now.  Her heart is borderline enlarged on the x-ray based on my read of it as she has minimally elevated blood pressure but there is no evidence of pulmonary edema or acute heart failure as the cause.  Will try to give her low-dose of Ativan as a suspect this mostly anxiety related.  Significant improvement after ativan. rx for same. Resources for counseling provided.      Pertinent labs & imaging results that were available during my care of the patient were reviewed by me and considered in my medical decision making (see chart for details).  A medical screening exam was performed and I feel the patient has had an appropriate workup for their chief complaint at this time and likelihood of emergent condition existing is low. They have been counseled on decision, discharge, follow up and which symptoms necessitate immediate return to the emergency department. They or their family verbally stated understanding and agreement with plan and discharged in stable condition.   ____________________________________________  FINAL CLINICAL IMPRESSION(S) / ED DIAGNOSES  Final diagnoses:  Shortness of breath     MEDICATIONS GIVEN DURING THIS VISIT:   Medications  LORazepam (ATIVAN) tablet 0.5 mg (0.5 mg Oral Given 10/07/18 0136)     NEW OUTPATIENT MEDICATIONS STARTED DURING THIS VISIT:  Discharge Medication List as of 10/07/2018  2:50 AM    START taking these medications   Details  LORazepam (ATIVAN) 1 MG tablet Take 0.5 tablets (0.5 mg total) by mouth every 8 (eight) hours as needed for anxiety., Starting Thu 10/07/2018, Print        Note:  This note was prepared with assistance of Dragon voice recognition software. Occasional wrong-word or sound-a-like substitutions may have occurred due to the inherent limitations of voice recognition software.   Eren Ryser, Barbara Cower, MD 10/07/18 [phone]

## 2019-11-11 IMAGING — DX CHEST - 2 VIEW
2 series · 2 of 2 positions shown · non-contrast
Comparison: None.

CLINICAL DATA: Shortness of breath

EXAM:
CHEST - 2 VIEW

[chest pa]
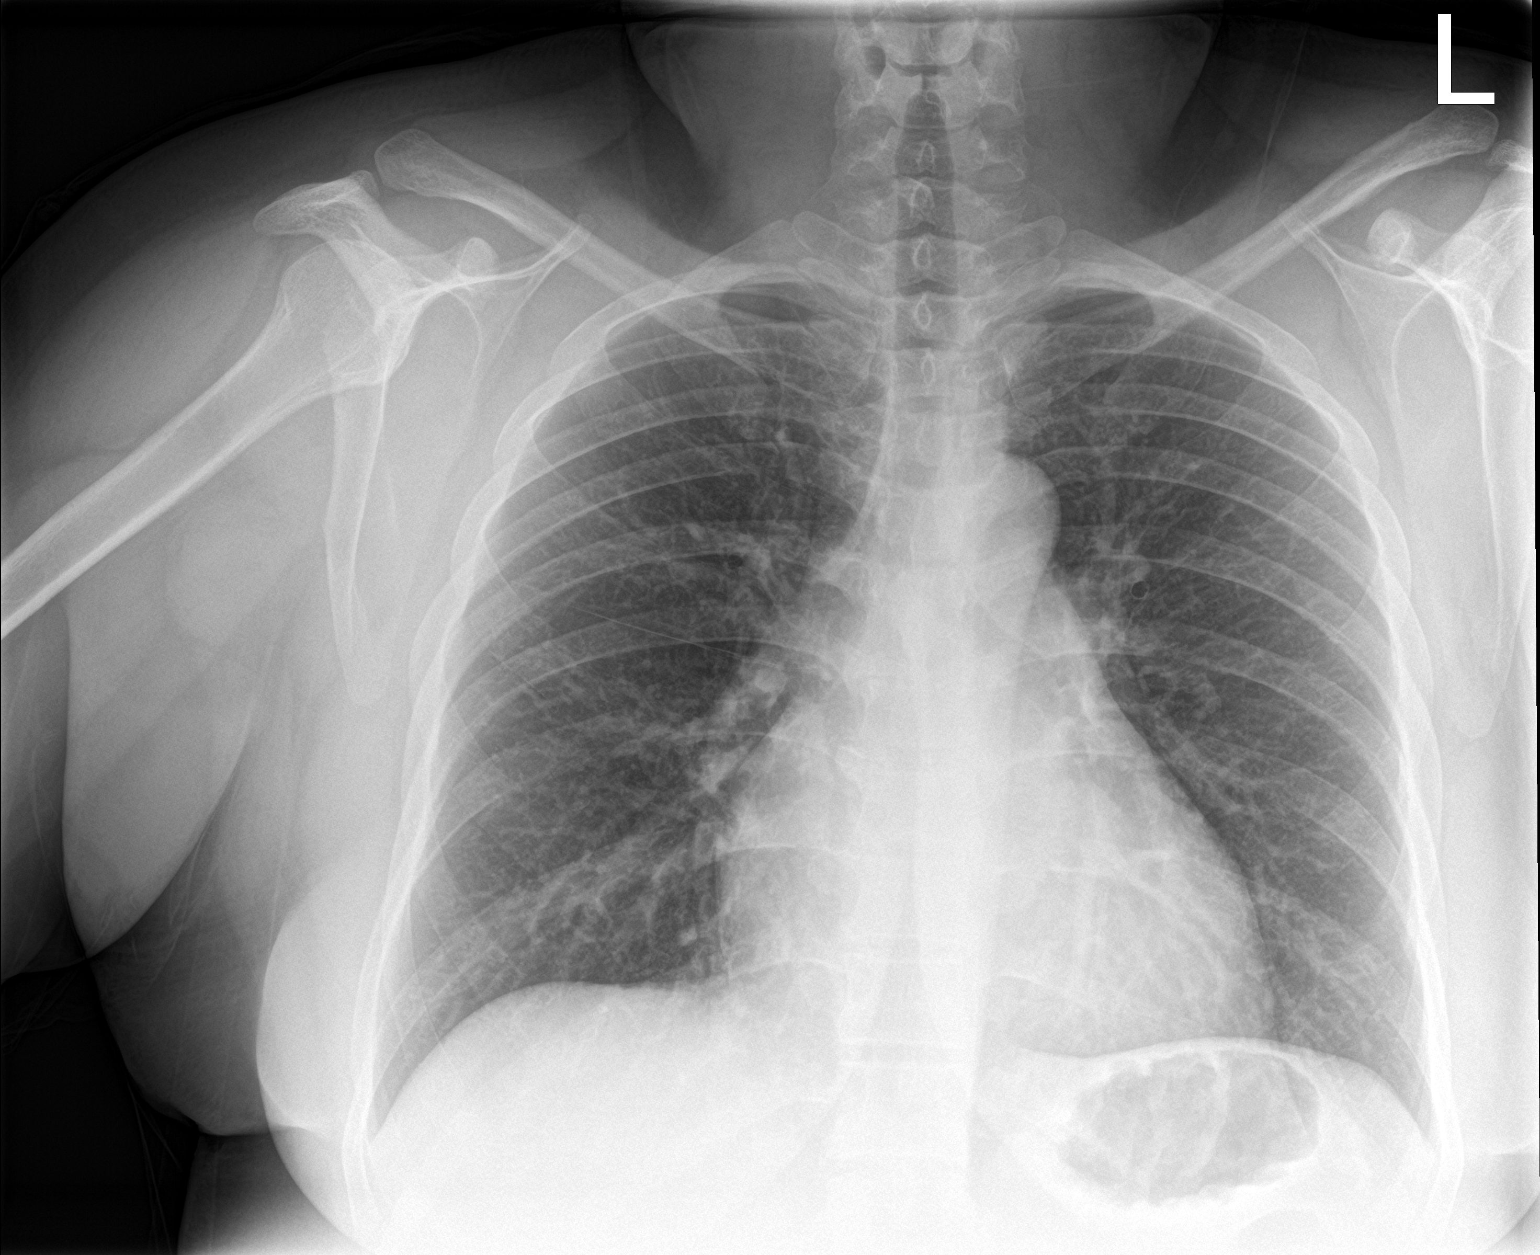

[chest lat]
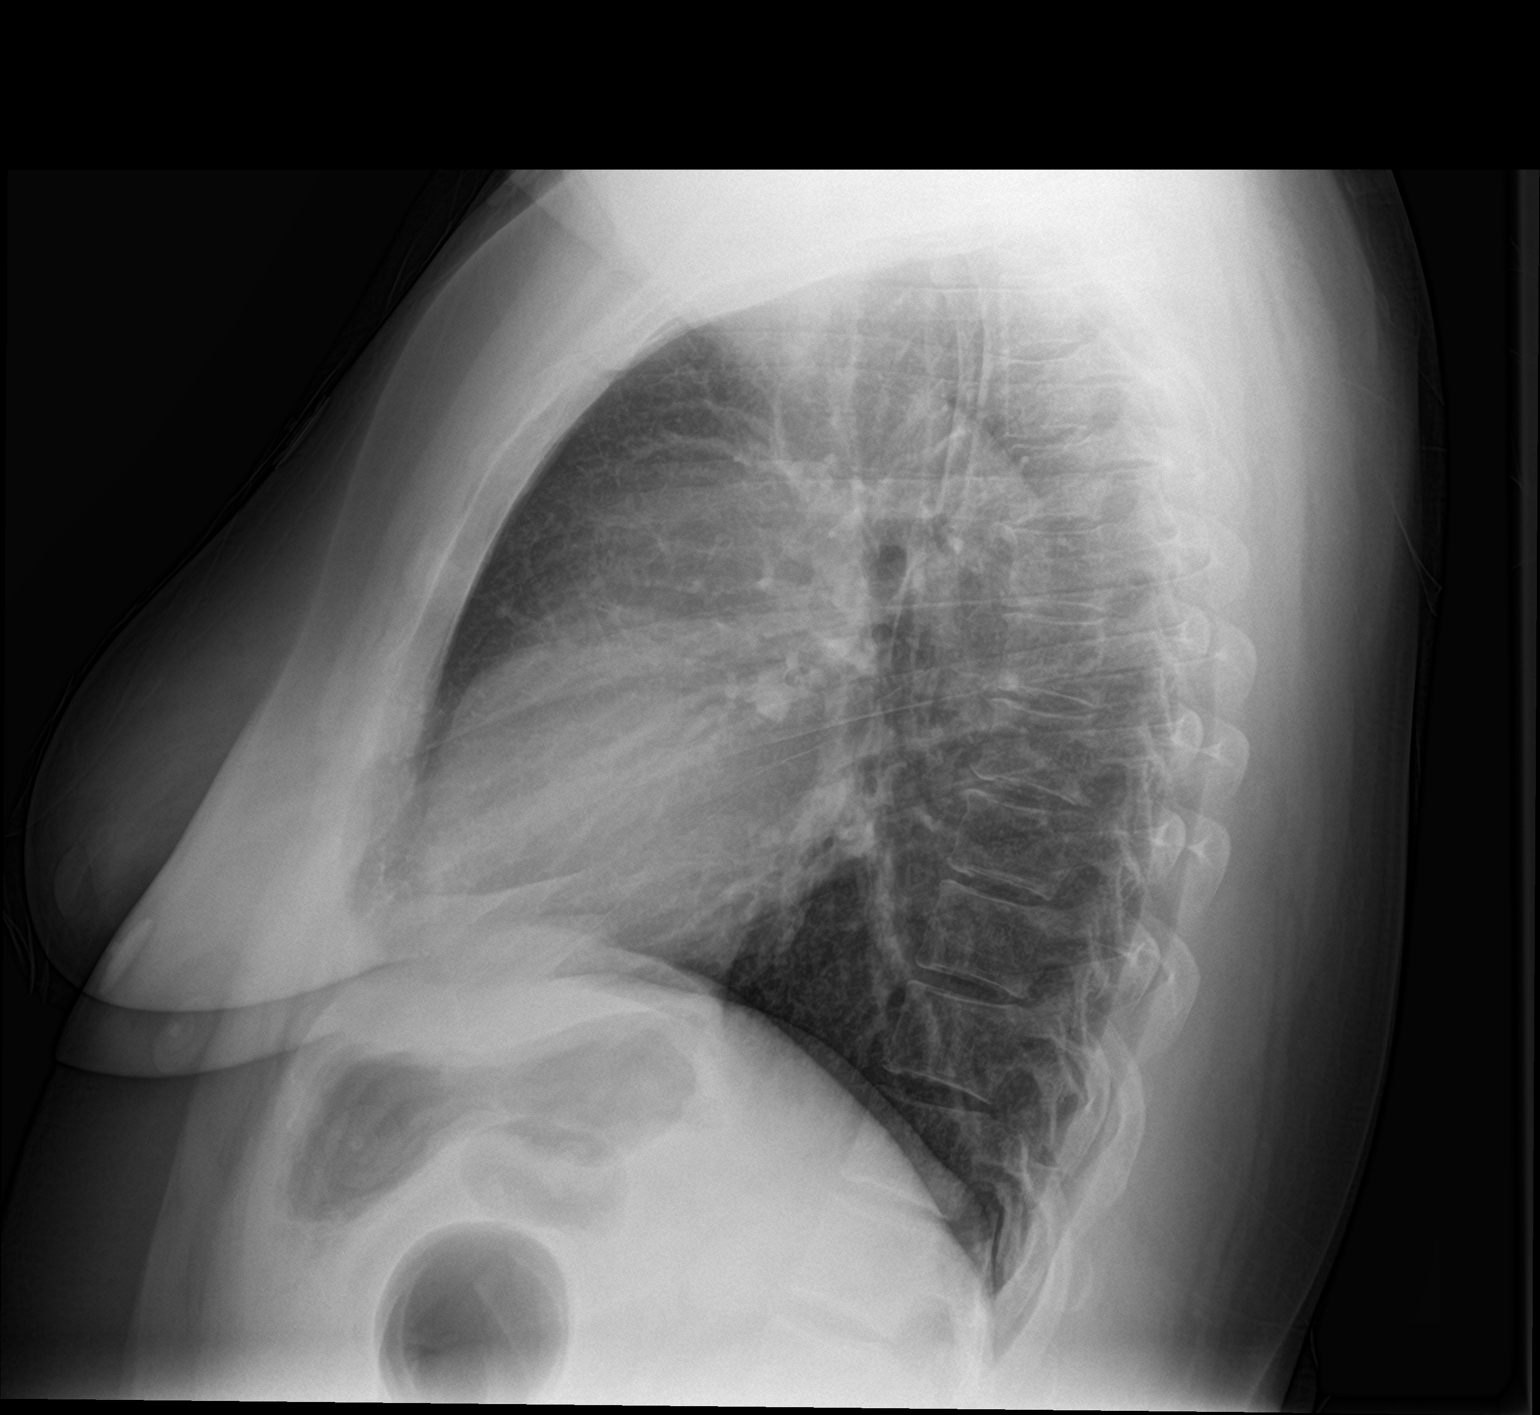

[2 of 2 positions shown; findings below may reference images not displayed]

FINDINGS: The cardiac size is at the upper limits of normal. Both lungs are
clear. The visualized skeletal structures are unremarkable.
IMPRESSION: Borderline enlarged heart.  No acute cardiopulmonary process.
